# Patient Record
Sex: Female | Born: 1995 | Race: White | Hispanic: No | Marital: Single | Smoking: Never smoker
Health system: Southern US, Community
[De-identification: ages and names within clinical notes are randomized; demographics above are authoritative.]

## PROBLEM LIST (undated history)

## (undated) DIAGNOSIS — J45909 Unspecified asthma, uncomplicated: Secondary | ICD-10-CM

## (undated) DIAGNOSIS — E079 Disorder of thyroid, unspecified: Secondary | ICD-10-CM

## (undated) HISTORY — DX: Disorder of thyroid, unspecified: E07.9

## (undated) HISTORY — DX: Unspecified asthma, uncomplicated: J45.909

---
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
## 2014-06-06 ENCOUNTER — Ambulatory Visit: Payer: Self-pay | Admitting: Family Medicine

## 2014-12-02 IMAGING — CR DG SHOULDER 3+V*L*
1 series · 4 of 4 positions shown · non-contrast
Comparison: none

REASON FOR EXAM: painful
COMMENTS:

[Series 1: w shoulder external left · 0.14mm/px · 4 of 4 slices shown]
[im 1/4]
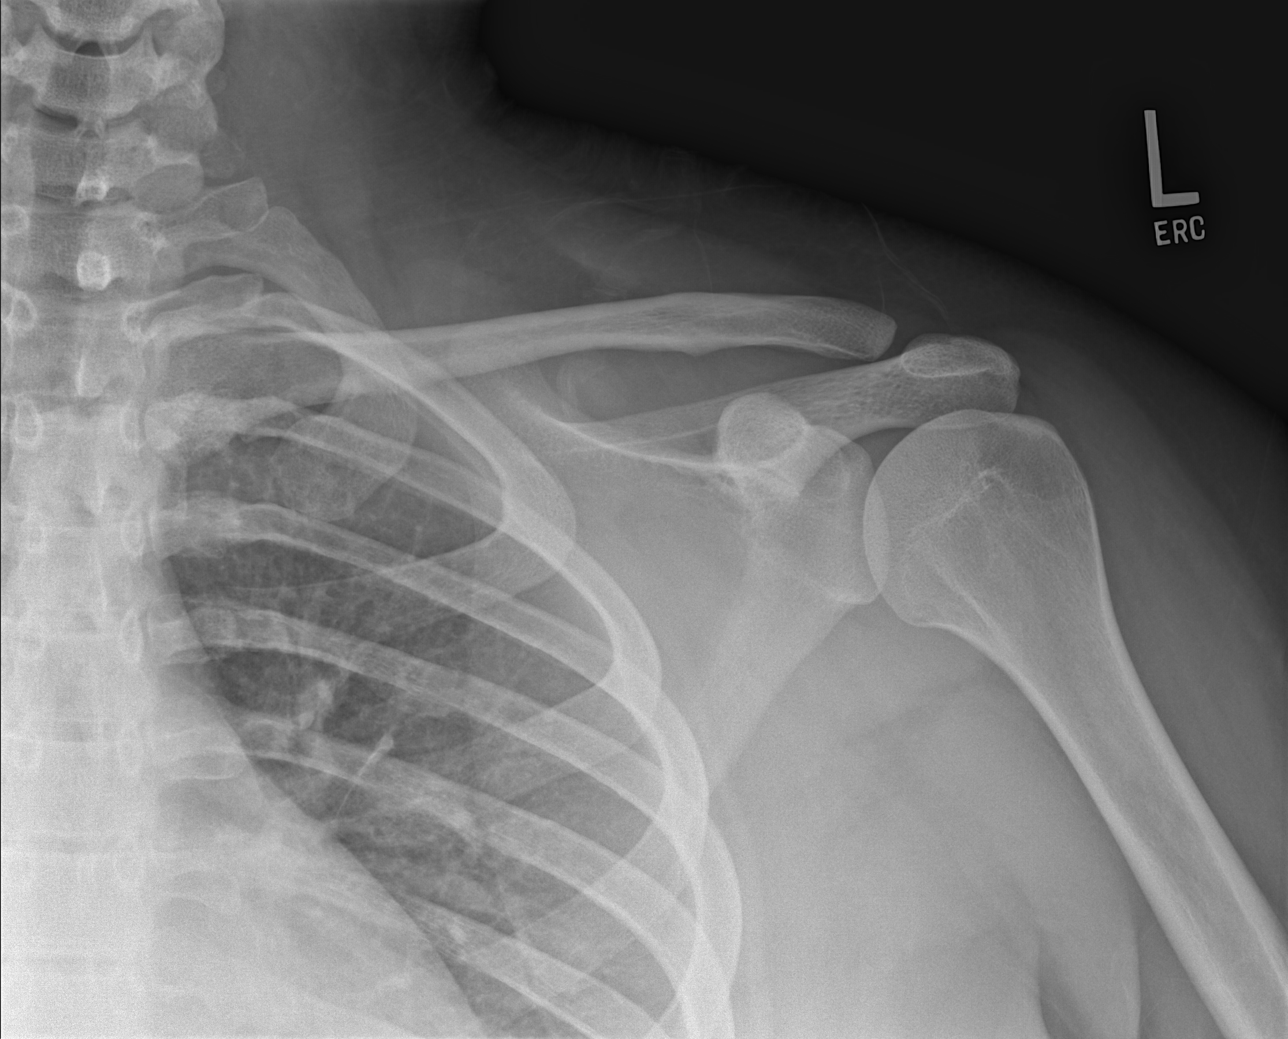
[im 2/4]
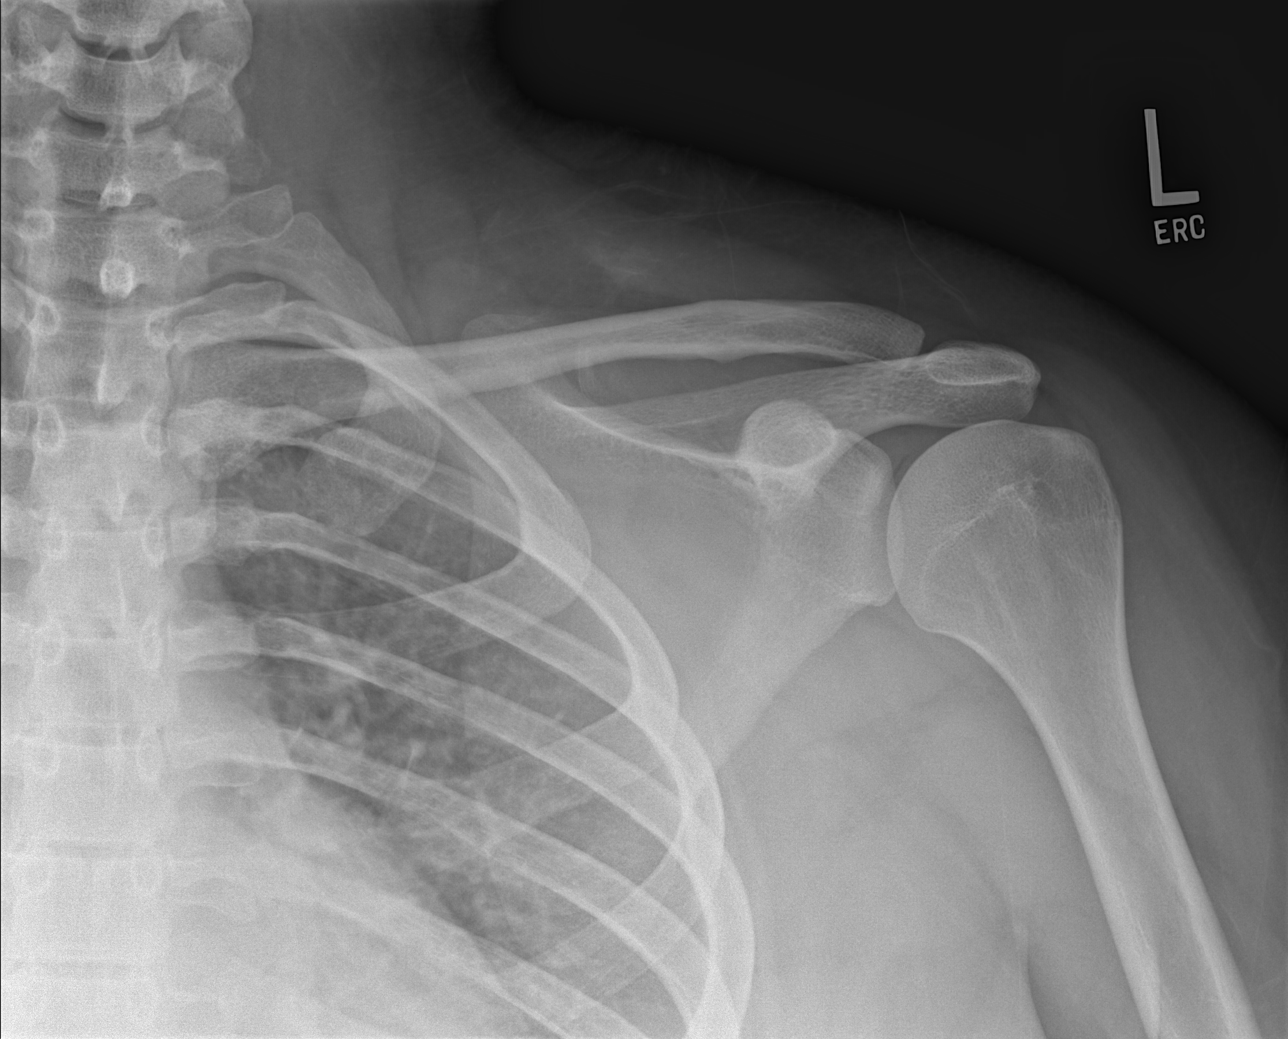
[im 3/4]
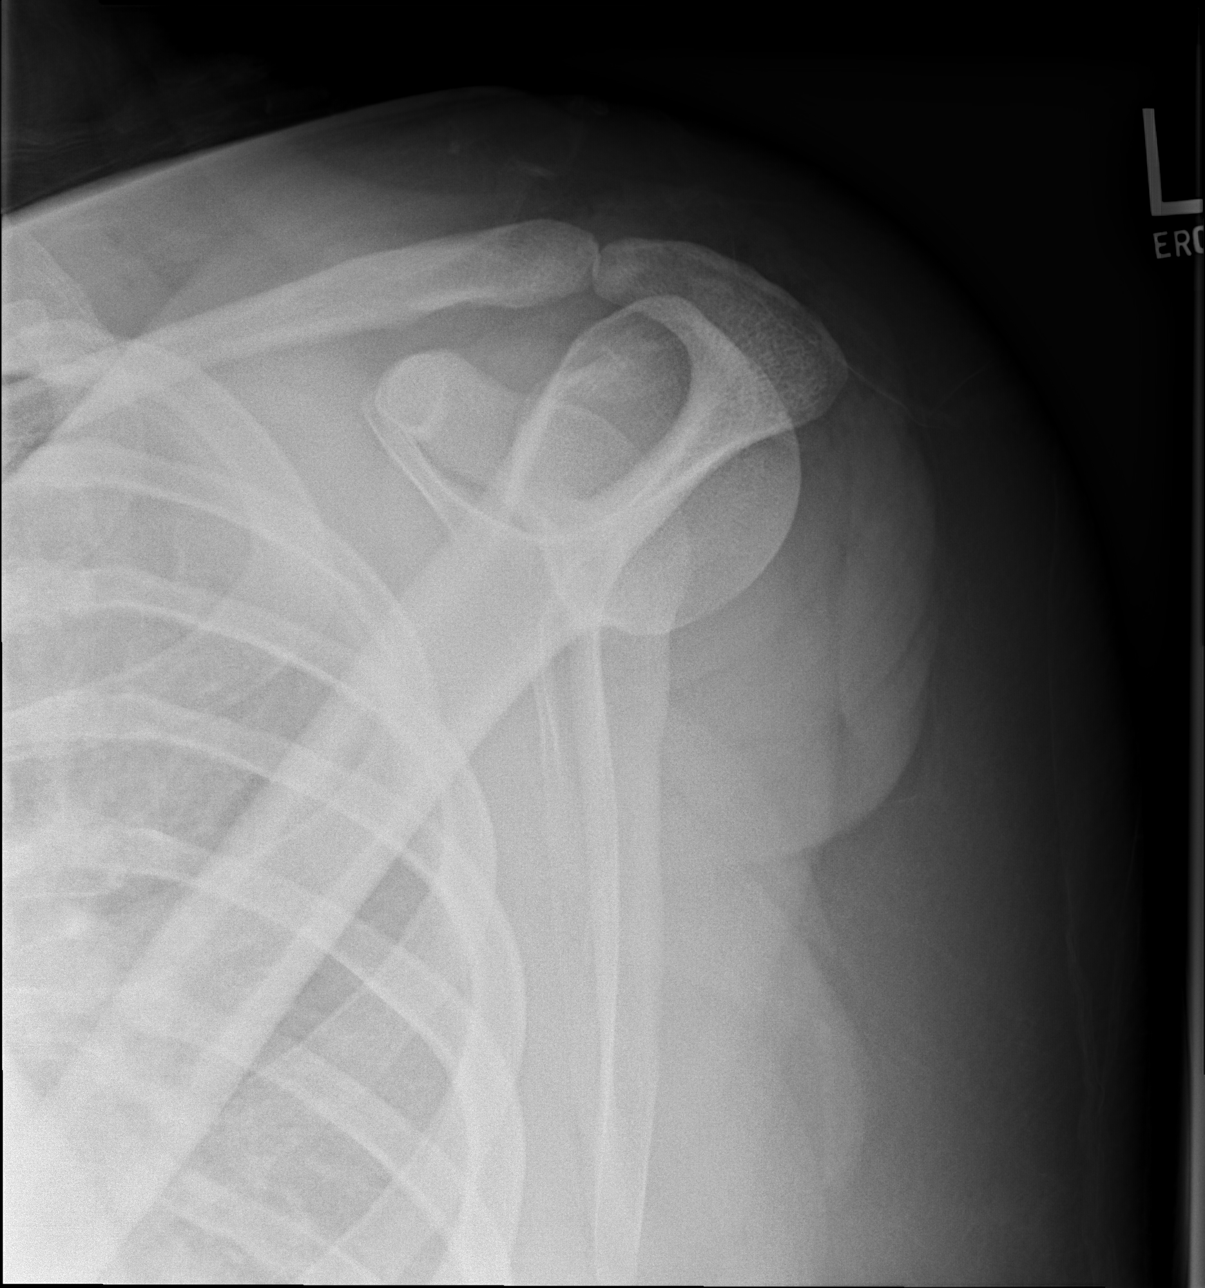
[im 4/4]
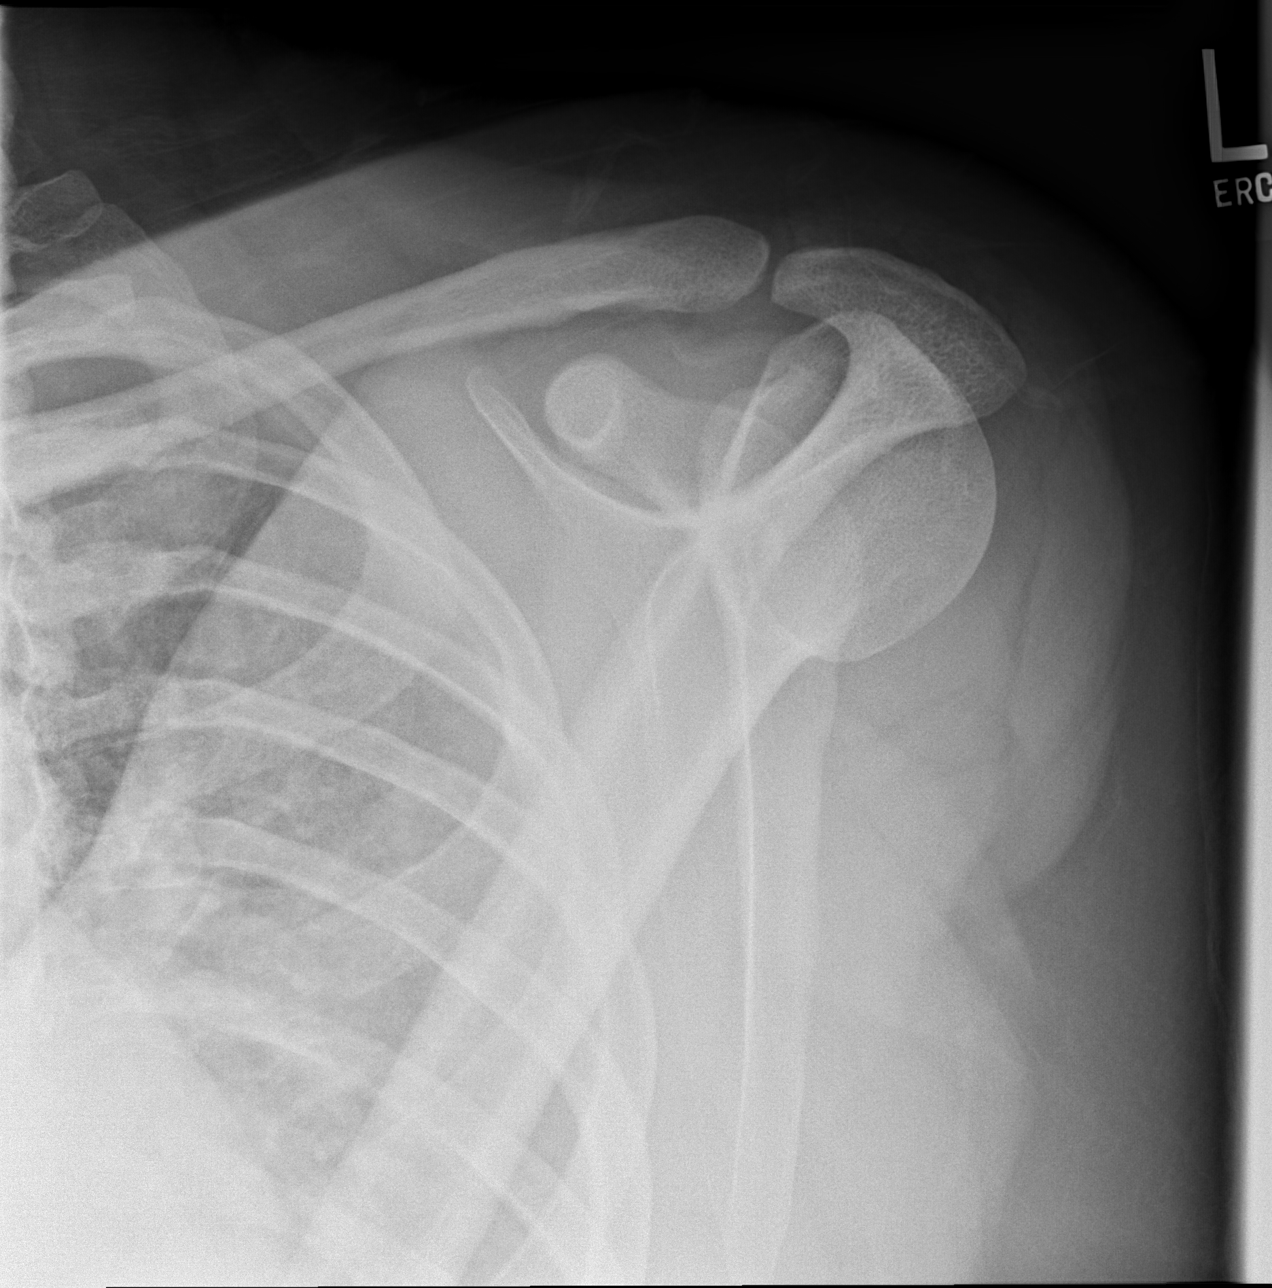

[4 of 4 positions shown; findings below may reference images not displayed]

PROCEDURE:     DXR - DXR SHOULDER LEFT COMPLETE  - August 15, 2012  [DATE]

RESULT:     Four views of the left shoulder reveal the bones to be
adequately mineralized. There is no evidence of an acute fracture. The
glenohumeral joint and AC joint are normally positioned. The overlying soft
tissues exhibit no abnormal calcifications.
IMPRESSION: I do not see acute bony abnormality of the left shoulder
nor evidence of significant degenerative change.

[REDACTED]

## 2015-07-16 ENCOUNTER — Encounter: Payer: Self-pay | Admitting: Family Medicine

## 2015-07-16 ENCOUNTER — Ambulatory Visit (INDEPENDENT_AMBULATORY_CARE_PROVIDER_SITE_OTHER): Payer: BLUE CROSS/BLUE SHIELD | Admitting: Family Medicine

## 2015-07-16 ENCOUNTER — Other Ambulatory Visit: Payer: Self-pay

## 2015-07-16 VITALS — BP 120/80 | HR 80 | Ht 67.0 in | Wt 357.0 lb

## 2015-07-16 DIAGNOSIS — J4520 Mild intermittent asthma, uncomplicated: Secondary | ICD-10-CM

## 2015-07-16 DIAGNOSIS — J40 Bronchitis, not specified as acute or chronic: Secondary | ICD-10-CM

## 2015-07-16 DIAGNOSIS — J0100 Acute maxillary sinusitis, unspecified: Secondary | ICD-10-CM | POA: Diagnosis not present

## 2015-07-16 MED ORDER — ALBUTEROL SULFATE (2.5 MG/3ML) 0.083% IN NEBU
2.5000 mg | INHALATION_SOLUTION | Freq: Four times a day (QID) | RESPIRATORY_TRACT | Status: DC | PRN
Start: 2015-07-16 — End: 2016-03-30

## 2015-07-16 MED ORDER — AMOXICILLIN 500 MG PO CAPS: 500.0000 mg | ORAL_CAPSULE | Freq: Three times a day (TID) | ORAL | Status: DC

## 2015-07-16 MED ORDER — GUAIFENESIN-CODEINE 100-10 MG/5ML PO SOLN
5.0000 mL | Freq: Three times a day (TID) | ORAL | Status: DC | PRN
Start: 2015-07-16 — End: 2015-11-19

## 2015-07-16 NOTE — Progress Notes (Signed)
Name: Andrea Vincent   MRN: [id number]    DOB: 08/22/1995   Date:07/16/2015       Progress Note  Subjective  Chief Complaint  Chief Complaint  Patient presents with  . Sinusitis  . Asthma    need refill on neb solution    Sinusitis This is a chronic problem. The current episode started in the past 7 days. The problem has been waxing and waning since onset. There has been no fever. Associated symptoms include coughing, shortness of breath, sinus pressure, sneezing and a sore throat. Pertinent negatives include no chills, ear pain, headaches or neck pain. Past treatments include acetaminophen. The treatment provided mild relief.  Asthma She complains of cough, shortness of breath and wheezing. There is no sputum production. Associated symptoms include nasal congestion, postnasal drip, sneezing and a sore throat. Pertinent negatives include no chest pain, ear pain, fever, headaches, heartburn, malaise/fatigue, myalgias, rhinorrhea or weight loss. Her past medical history is significant for asthma.  Cough This is a new problem. The current episode started in the past 7 days. The problem has been gradually worsening. The problem occurs constantly. The cough is non-productive. Associated symptoms include nasal congestion, postnasal drip, a sore throat, shortness of breath and wheezing. Pertinent negatives include no chest pain, chills, ear pain, fever, headaches, heartburn, myalgias, rash, rhinorrhea or weight loss. The symptoms are aggravated by cold air. She has tried a beta-agonist inhaler for the symptoms. The treatment provided no relief. Her past medical history is significant for asthma. There is no history of environmental allergies.    No problem-specific assessment & plan notes found for this encounter.   History reviewed. No pertinent past medical history.  History reviewed. No pertinent past surgical history.  Family History  Problem Relation Age of Onset  . Diabetes Mother   .  Diabetes Father   . Hypertension Father   . Hyperlipidemia Father     Social History   Social History  . Marital Status: Single    Spouse Name: N/A  . Number of Children: N/A  . Years of Education: N/A   Occupational History  . Not on file.   Social History Main Topics  . Smoking status: Never Smoker   . Smokeless tobacco: Not on file  . Alcohol Use: No  . Drug Use: No  . Sexual Activity: Yes   Other Topics Concern  . Not on file   Social History Narrative  . No narrative on file    Allergies  Allergen Reactions  . Sulfa Antibiotics Other (See Comments)     Review of Systems  Constitutional: Negative for fever, chills, weight loss and malaise/fatigue.  HENT: Positive for postnasal drip, sinus pressure, sneezing and sore throat. Negative for ear discharge, ear pain and rhinorrhea.   Eyes: Negative for blurred vision.  Respiratory: Positive for cough, shortness of breath and wheezing. Negative for sputum production.   Cardiovascular: Negative for chest pain, palpitations and leg swelling.  Gastrointestinal: Negative for heartburn, nausea, abdominal pain, diarrhea, constipation, blood in stool and melena.  Genitourinary: Negative for dysuria, urgency, frequency and hematuria.  Musculoskeletal: Negative for myalgias, back pain, joint pain and neck pain.  Skin: Negative for rash.  Neurological: Negative for dizziness, tingling, sensory change, focal weakness and headaches.  Endo/Heme/Allergies: Negative for environmental allergies and polydipsia. Does not bruise/bleed easily.  Psychiatric/Behavioral: Negative for depression and suicidal ideas. The patient is not nervous/anxious and does not have insomnia.      Objective  Filed Vitals:  07/16/15 1339  BP: 120/80  Pulse: 80  Height: 5\' 7"  (1.702 m)  Weight: 357 lb (161.934 kg)    Physical Exam  Constitutional: She is well-developed, well-nourished, and in no distress. No distress.  HENT:  Head: Normocephalic  and atraumatic.  Right Ear: External ear normal.  Left Ear: External ear normal.  Nose: Nose normal.  Mouth/Throat: Oropharynx is clear and moist.  Eyes: Conjunctivae and EOM are normal. Pupils are equal, round, and reactive to light. Right eye exhibits no discharge. Left eye exhibits no discharge.  Neck: Normal range of motion. Neck supple. No JVD present. No thyromegaly present.  Cardiovascular: Normal rate, regular rhythm, normal heart sounds and intact distal pulses.  Exam reveals no gallop and no friction rub.   No murmur heard. Pulmonary/Chest: Effort normal and breath sounds normal.  Abdominal: Soft. Bowel sounds are normal. She exhibits no mass. There is no tenderness. There is no guarding.  Musculoskeletal: Normal range of motion. She exhibits no edema.  Lymphadenopathy:    She has no cervical adenopathy.  Neurological: She is alert. She has normal reflexes.  Skin: Skin is warm and dry. She is not diaphoretic.  Psychiatric: Mood and affect normal.      Assessment & Plan  Problem List Items Addressed This Visit    None    Visit Diagnoses    Acute maxillary sinusitis, recurrence not specified    -  Primary    Relevant Medications    amoxicillin (AMOXIL) 500 MG capsule    guaiFENesin-codeine 100-10 MG/5ML syrup    Bronchitis        Relevant Medications    amoxicillin (AMOXIL) 500 MG capsule    guaiFENesin-codeine 100-10 MG/5ML syrup    Asthma, mild intermittent, uncomplicated        Relevant Medications    albuterol (PROVENTIL) (2.5 MG/3ML) 0.083% nebulizer solution         Dr. Hayden [facility]  07/16/2015

## 2015-07-29 ENCOUNTER — Other Ambulatory Visit: Payer: Self-pay

## 2015-10-23 IMAGING — CR DG ANKLE COMPLETE 3+V*L*
1 series · 3 of 3 positions shown · non-contrast
Comparison: None.

CLINICAL DATA: Fall with left ankle pain and swelling.

EXAM:
LEFT ANKLE COMPLETE - 3+ VIEW

[Series 1: ap · 0.17mm/px · 3 of 3 slices shown]
[im 1/3]
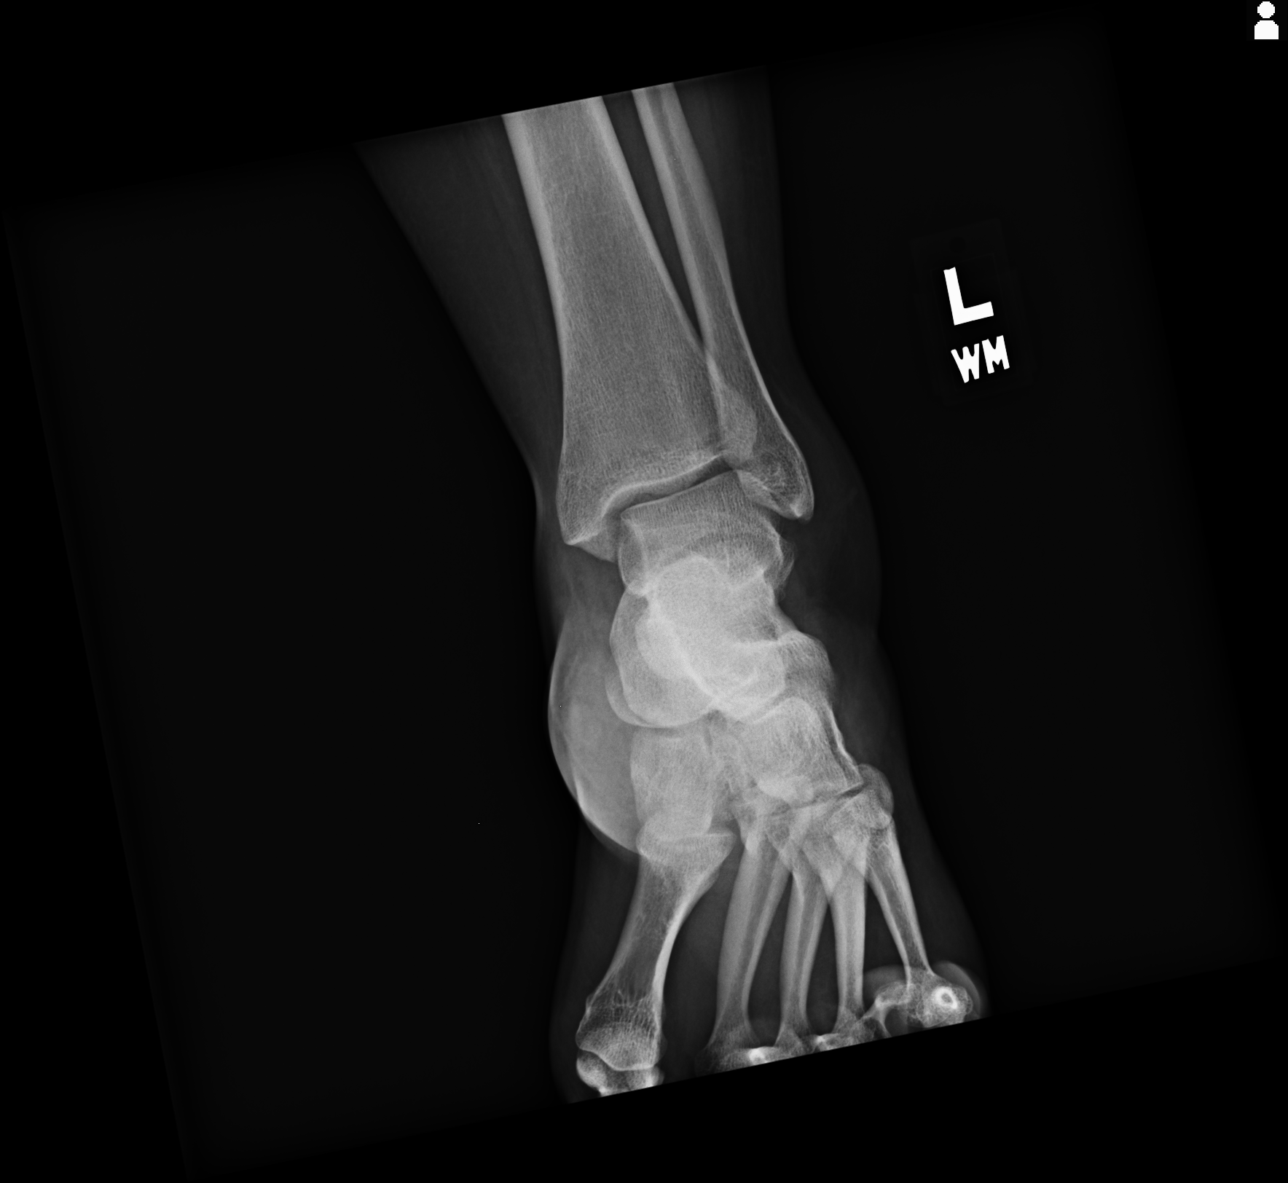
[im 2/3]
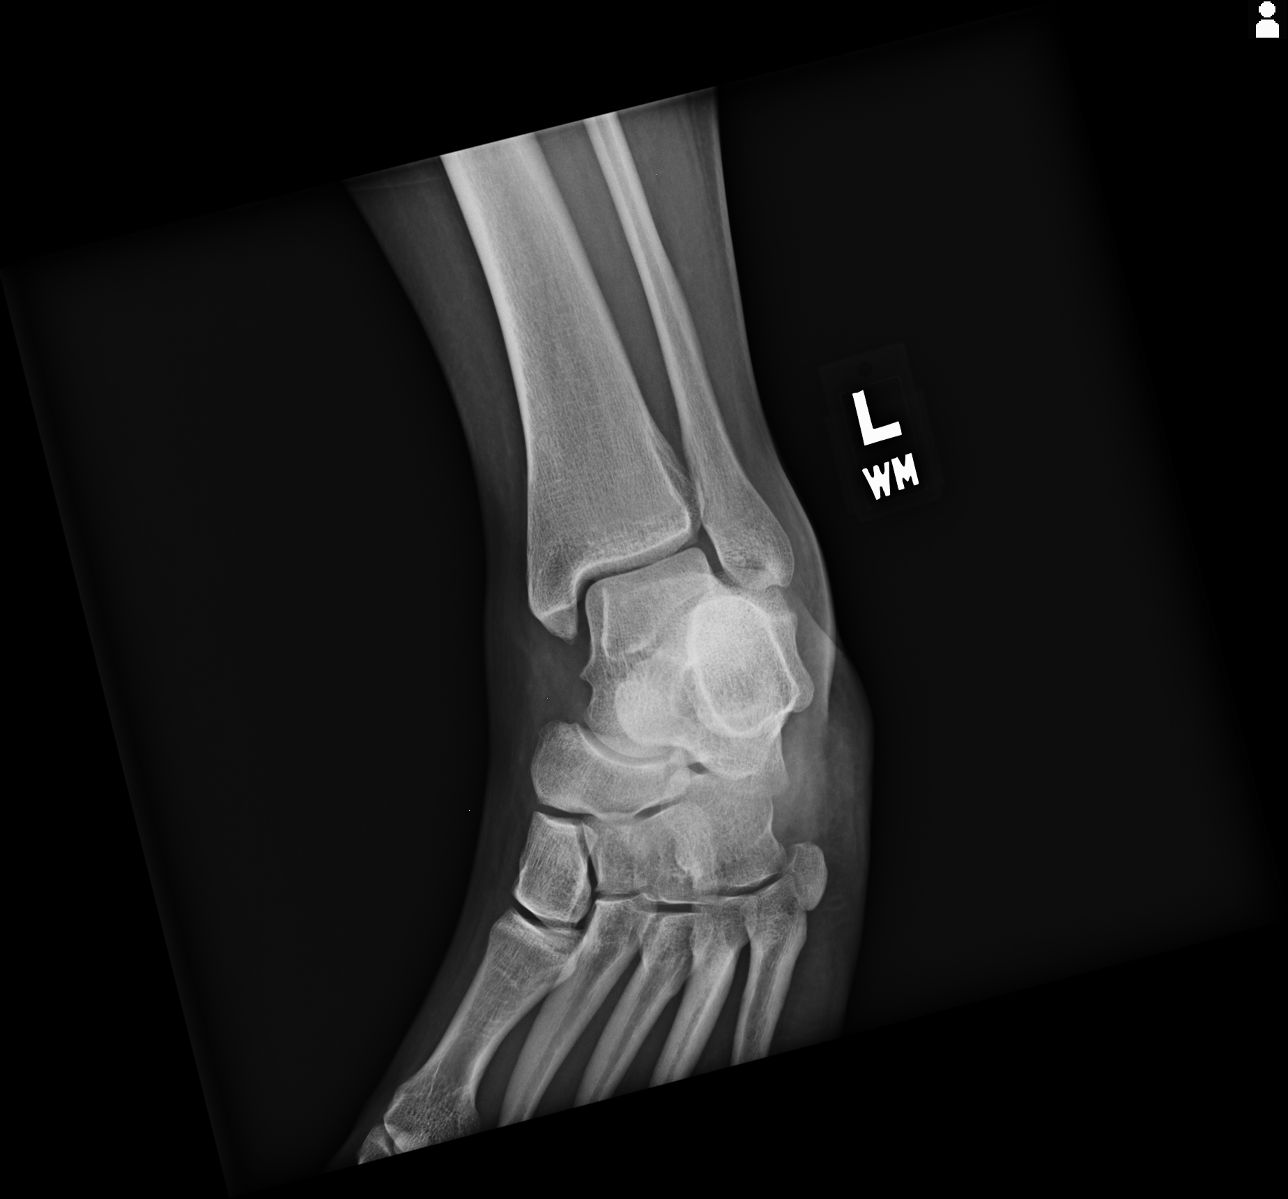
[im 3/3]
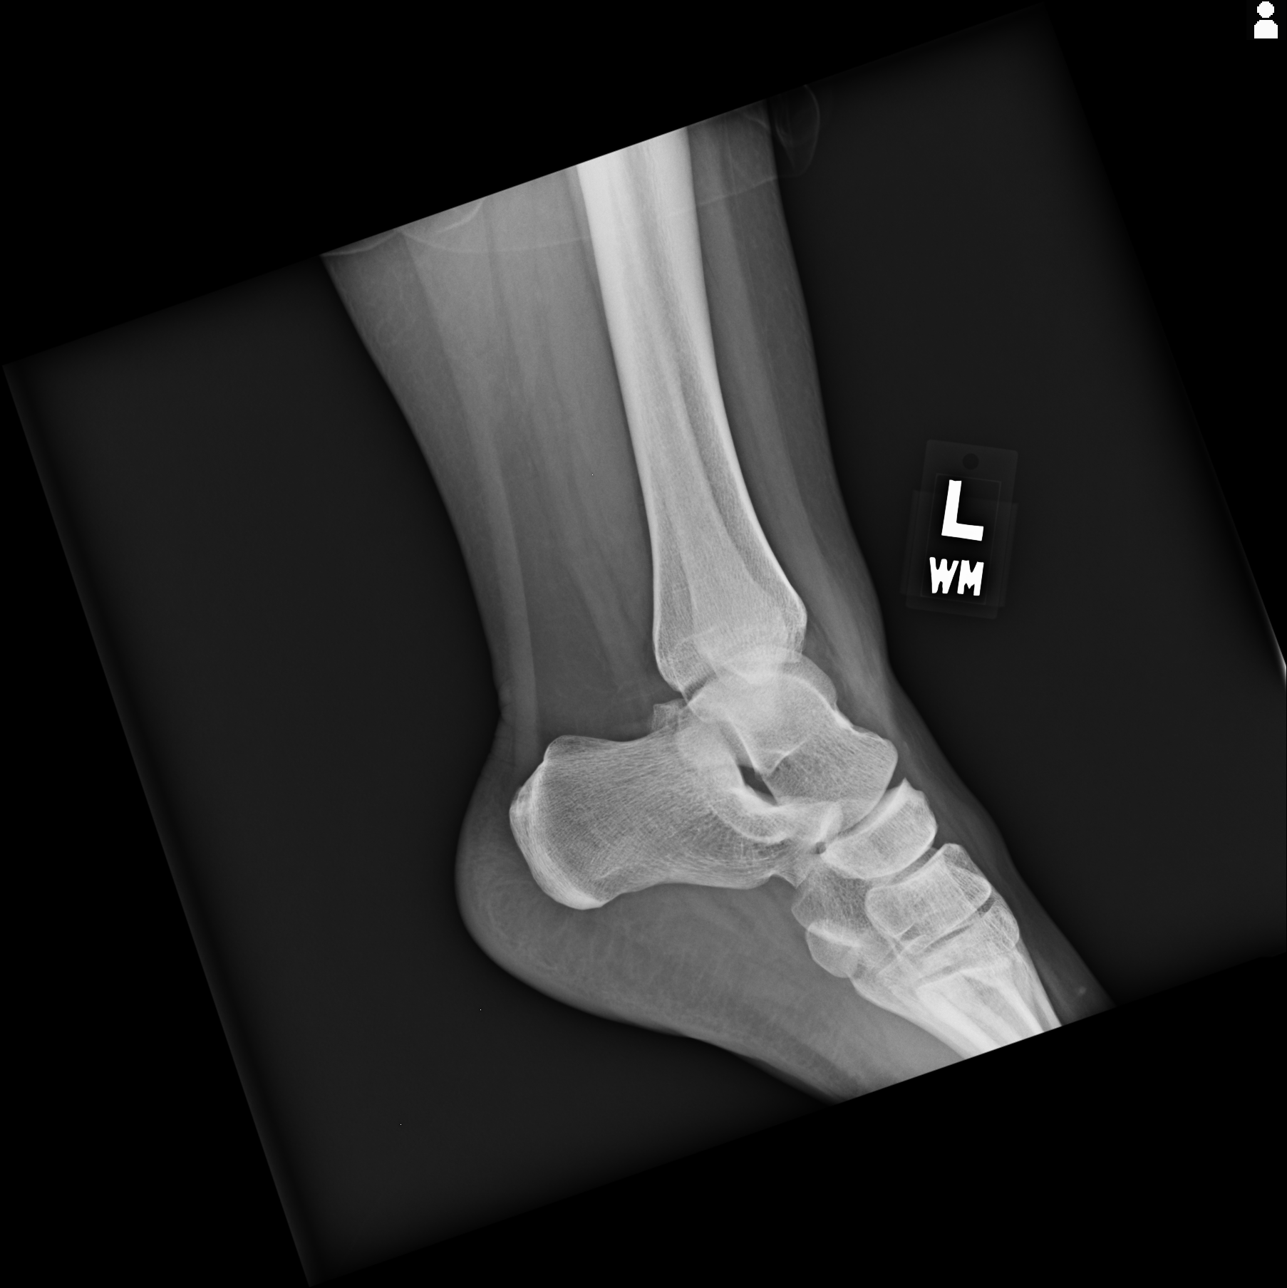

[3 of 3 positions shown; findings below may reference images not displayed]

FINDINGS: Soft tissue swelling present without evidence of acute fracture or
dislocation. The ankle mortise shows normal alignment. No
significant arthropathy is identified. No evidence of bony lesion or
destruction. No soft tissue foreign body is visualized.
IMPRESSION: Soft tissue swelling without evidence of acute fracture.

## 2015-11-19 ENCOUNTER — Encounter: Payer: Self-pay | Admitting: Family Medicine

## 2015-11-19 ENCOUNTER — Ambulatory Visit (INDEPENDENT_AMBULATORY_CARE_PROVIDER_SITE_OTHER): Payer: BLUE CROSS/BLUE SHIELD | Admitting: Family Medicine

## 2015-11-19 VITALS — BP 120/88 | HR 88 | Temp 98.0°F | Ht 67.0 in | Wt 360.0 lb

## 2015-11-19 DIAGNOSIS — J029 Acute pharyngitis, unspecified: Secondary | ICD-10-CM

## 2015-11-19 MED ORDER — AZITHROMYCIN 250 MG PO TABS: ORAL_TABLET | ORAL | Status: DC

## 2015-11-19 NOTE — Addendum Note (Signed)
Addended by: Everitt AmberLYNCH, Cleo Santucci L on: 11/19/2015 12:28 PM   Modules accepted: Orders

## 2015-11-19 NOTE — Progress Notes (Signed)
Name: Andrea Vincent   MRN: [id number]    DOB: 1995/11/22   Date:11/19/2015       Progress Note  Subjective  Chief Complaint  Chief Complaint  Patient presents with  . Sinusitis    been sick x 2 weeks- [facility] and was given Amoxil x 4 days and then penicillin x 10 days- no better    Sinusitis This is a recurrent problem. The current episode started 1 to 4 weeks ago. The problem has been waxing and waning since onset. There has been no fever. Associated symptoms include congestion. Pertinent negatives include no chills, coughing, diaphoresis, ear pain, headaches, hoarse voice, neck pain, shortness of breath, sinus pressure, sneezing, sore throat or swollen glands. Past treatments include acetaminophen. The treatment provided no relief.    No problem-specific assessment & plan notes found for this encounter.   History reviewed. No pertinent past medical history.  History reviewed. No pertinent past surgical history.  Family History  Problem Relation Age of Onset  . Diabetes Mother   . Diabetes Father   . Hypertension Father   . Hyperlipidemia Father     Social History   Social History  . Marital Status: Single    Spouse Name: N/A  . Number of Children: N/A  . Years of Education: N/A   Occupational History  . Not on file.   Social History Main Topics  . Smoking status: Never Smoker   . Smokeless tobacco: Not on file  . Alcohol Use: No  . Drug Use: No  . Sexual Activity: Yes   Other Topics Concern  . Not on file   Social History Narrative    Allergies  Allergen Reactions  . Sulfa Antibiotics Other (See Comments)     Review of Systems  Constitutional: Negative for fever, chills, weight loss, malaise/fatigue and diaphoresis.  HENT: Positive for congestion. Negative for ear discharge, ear pain, hoarse voice, sinus pressure, sneezing and sore throat.   Eyes: Negative for blurred vision.  Respiratory: Negative for cough, sputum production,  shortness of breath and wheezing.   Cardiovascular: Negative for chest pain, palpitations and leg swelling.  Gastrointestinal: Negative for heartburn, nausea, abdominal pain, diarrhea, constipation, blood in stool and melena.  Genitourinary: Negative for dysuria, urgency, frequency and hematuria.  Musculoskeletal: Negative for myalgias, back pain, joint pain and neck pain.  Skin: Negative for rash.  Neurological: Negative for dizziness, tingling, sensory change, focal weakness and headaches.  Endo/Heme/Allergies: Negative for environmental allergies and polydipsia. Does not bruise/bleed easily.  Psychiatric/Behavioral: Negative for depression and suicidal ideas. The patient is not nervous/anxious and does not have insomnia.      Objective  Filed Vitals:   11/19/15 1018  BP: 120/88  Pulse: 88  Temp: 98 F (36.7 C)  TempSrc: Oral  Height:  (1.702 m)  Weight: 360 lb (163.295 kg)    Physical Exam  Constitutional: She is well-developed, well-nourished, and in no distress. No distress.  HENT:  Head: Normocephalic and atraumatic.  Right Ear: External ear normal.  Left Ear: External ear normal.  Nose: Nose normal.  Mouth/Throat: Oropharynx is clear and moist.  Eyes: Conjunctivae and EOM are normal. Pupils are equal, round, and reactive to light. Right eye exhibits no discharge. Left eye exhibits no discharge.  Neck: Normal range of motion. Neck supple. No JVD present. No thyromegaly present.  Cardiovascular: Normal rate, regular rhythm, normal heart sounds and intact distal pulses.  Exam reveals no gallop and no friction rub.  No murmur heard. Pulmonary/Chest: Effort normal and breath sounds normal.  Abdominal: Soft. Bowel sounds are normal. She exhibits no mass. There is no tenderness. There is no guarding.  Musculoskeletal: Normal range of motion. She exhibits no edema.  Lymphadenopathy:    She has no cervical adenopathy.  Neurological: She is alert. She has normal reflexes.   Skin: Skin is warm and dry. She is not diaphoretic.  Psychiatric: Mood and affect normal.  Nursing note and vitals reviewed.     Assessment & Plan  Problem List Items Addressed This Visit    None    Visit Diagnoses    Pharyngitis    -  Primary    Relevant Medications    azithromycin (ZITHROMAX) 250 MG tablet         Dr. Hayden Rasmusseneanna Berda [facility]  11/19/2015

## 2016-03-03 ENCOUNTER — Other Ambulatory Visit: Payer: Self-pay

## 2016-03-30 ENCOUNTER — Other Ambulatory Visit: Payer: Self-pay

## 2016-03-30 DIAGNOSIS — J4520 Mild intermittent asthma, uncomplicated: Secondary | ICD-10-CM

## 2016-03-30 MED ORDER — ALBUTEROL SULFATE (2.5 MG/3ML) 0.083% IN NEBU: 2.5000 mg | INHALATION_SOLUTION | Freq: Four times a day (QID) | RESPIRATORY_TRACT | 0 refills | Status: DC | PRN

## 2016-04-27 ENCOUNTER — Encounter: Payer: Self-pay | Admitting: Family Medicine

## 2016-04-27 ENCOUNTER — Ambulatory Visit (INDEPENDENT_AMBULATORY_CARE_PROVIDER_SITE_OTHER): Payer: BLUE CROSS/BLUE SHIELD | Admitting: Family Medicine

## 2016-04-27 VITALS — BP 100/64 | HR 80 | Ht 67.0 in | Wt 356.0 lb

## 2016-04-27 DIAGNOSIS — J4520 Mild intermittent asthma, uncomplicated: Secondary | ICD-10-CM

## 2016-04-27 MED ORDER — ALBUTEROL SULFATE HFA 108 (90 BASE) MCG/ACT IN AERS: 1.0000 | INHALATION_SPRAY | Freq: Four times a day (QID) | RESPIRATORY_TRACT | 11 refills | Status: AC | PRN

## 2016-04-27 MED ORDER — ALBUTEROL SULFATE (2.5 MG/3ML) 0.083% IN NEBU: 2.5000 mg | INHALATION_SOLUTION | Freq: Four times a day (QID) | RESPIRATORY_TRACT | 11 refills | Status: DC | PRN

## 2016-04-27 NOTE — Progress Notes (Signed)
Name: Andrea Vincent   MRN: [id number]    DOB: 28-May-1996   Date:04/27/2016       Progress Note  Subjective  Chief Complaint  Chief Complaint  Patient presents with  . Asthma    needs nebulizer and inhaler med    Asthma  She complains of wheezing. There is no chest tightness, cough, difficulty breathing, frequent throat clearing, hemoptysis, hoarse voice, shortness of breath or sputum production. This is a recurrent problem. The current episode started more than 1 year ago. The problem occurs daily. The problem has been waxing and waning. Pertinent negatives include no appetite change, chest pain, dyspnea on exertion, ear congestion, ear pain, fever, headaches, heartburn, malaise/fatigue, myalgias, nasal congestion, orthopnea, PND, postnasal drip, rhinorrhea, sneezing, sore throat, sweats, trouble swallowing or weight loss. Her symptoms are aggravated by nothing. Her symptoms are alleviated by beta-agonist. She reports minimal improvement on treatment. Risk factors: mold. Her past medical history is significant for asthma. There is no history of bronchiectasis, bronchitis, COPD, emphysema or pneumonia.    No problem-specific Assessment & Plan notes found for this encounter.   Past Medical History:  Diagnosis Date  . Asthma   . Thyroid disease     History reviewed. No pertinent surgical history.  Family History  Problem Relation Age of Onset  . Diabetes Mother   . Diabetes Father   . Hypertension Father   . Hyperlipidemia Father     Social History   Social History  . Marital status: Single    Spouse name: N/A  . Number of children: N/A  . Years of education: N/A   Occupational History  . Not on file.   Social History Main Topics  . Smoking status: Never Smoker  . Smokeless tobacco: Not on file  . Alcohol use No  . Drug use: No  . Sexual activity: Yes   Other Topics Concern  . Not on file   Social History Narrative  . No narrative on file    Allergies   Allergen Reactions  . Sulfa Antibiotics Other (See Comments)     Review of Systems  Constitutional: Negative for appetite change, chills, fever, malaise/fatigue and weight loss.  HENT: Negative for ear discharge, ear pain, hoarse voice, postnasal drip, rhinorrhea, sneezing, sore throat and trouble swallowing.   Eyes: Negative for blurred vision.  Respiratory: Positive for wheezing. Negative for cough, hemoptysis, sputum production and shortness of breath.   Cardiovascular: Negative for chest pain, dyspnea on exertion, palpitations, leg swelling and PND.  Gastrointestinal: Negative for abdominal pain, blood in stool, constipation, diarrhea, heartburn, melena and nausea.  Genitourinary: Negative for dysuria, frequency, hematuria and urgency.  Musculoskeletal: Negative for back pain, joint pain, myalgias and neck pain.  Skin: Negative for rash.  Neurological: Negative for dizziness, tingling, sensory change, focal weakness and headaches.  Endo/Heme/Allergies: Negative for environmental allergies and polydipsia. Does not bruise/bleed easily.  Psychiatric/Behavioral: Negative for depression and suicidal ideas. The patient is not nervous/anxious and does not have insomnia.      Objective  Vitals:   04/27/16 1015  BP: 100/64  Pulse: 80  Weight: (!) 356 lb (161.5 kg)  Height: 5\' 7"  (1.702 m)    Physical Exam  Constitutional: She is well-developed, well-nourished, and in no distress. No distress.  HENT:  Head: Normocephalic and atraumatic.  Right Ear: External ear normal.  Left Ear: External ear normal.  Nose: Nose normal.  Mouth/Throat: Oropharynx is clear and moist.  Eyes: Conjunctivae and EOM are normal. Pupils  are equal, round, and reactive to light. Right eye exhibits no discharge. Left eye exhibits no discharge.  Neck: Normal range of motion. Neck supple. No JVD present. No thyromegaly present.  Cardiovascular: Normal rate, regular rhythm, normal heart sounds and intact distal  pulses.  Exam reveals no gallop and no friction rub.   No murmur heard. Pulmonary/Chest: Effort normal and breath sounds normal.  Abdominal: Soft. Bowel sounds are normal. She exhibits no mass. There is no tenderness. There is no guarding.  Musculoskeletal: Normal range of motion. She exhibits no edema.  Lymphadenopathy:    She has no cervical adenopathy.  Neurological: She is alert. She has normal reflexes.  Skin: Skin is warm and dry. She is not diaphoretic.  Psychiatric: Mood and affect normal.      Assessment & Plan  Problem List Items Addressed This Visit    None    Visit Diagnoses    Mild intermittent asthma, unspecified whether complicated    -  Primary   Relevant Medications   albuterol (PROVENTIL) (2.5 MG/3ML) 0.083% nebulizer solution   albuterol (PROVENTIL HFA;VENTOLIN HFA) 108 (90 Base) MCG/ACT inhaler        Dr. Hayden Rasmusseneanna Louise [facility]  04/27/16

## 2016-07-23 ENCOUNTER — Other Ambulatory Visit: Payer: Self-pay

## 2016-08-21 ENCOUNTER — Other Ambulatory Visit: Payer: Self-pay

## 2016-08-21 DIAGNOSIS — J4520 Mild intermittent asthma, uncomplicated: Secondary | ICD-10-CM

## 2016-08-21 MED ORDER — ALBUTEROL SULFATE (2.5 MG/3ML) 0.083% IN NEBU: 2.5000 mg | INHALATION_SOLUTION | Freq: Four times a day (QID) | RESPIRATORY_TRACT | 11 refills | Status: AC | PRN

## 2019-04-21 ENCOUNTER — Other Ambulatory Visit: Payer: Self-pay

## 2019-04-21 ENCOUNTER — Ambulatory Visit (HOSPITAL_COMMUNITY): Admission: EM | Admit: 2019-04-21 | Discharge: 2019-04-21 | Discharge status: Absent without leave | Payer: Self-pay
